# Patient Record
Sex: Female | Born: 1965 | Race: Black or African American | Hispanic: No | Marital: Married | Smoking: Never smoker
Health system: Southern US, Community
[De-identification: ages and names within clinical notes are randomized; demographics above are authoritative.]

## PROBLEM LIST (undated history)

## (undated) HISTORY — PX: TUBAL LIGATION: SHX77

---

## 2002-11-01 ENCOUNTER — Emergency Department (HOSPITAL_COMMUNITY): Admission: EM | Admit: 2002-11-01 | Discharge: 2002-11-01 | Payer: Self-pay | Admitting: Emergency Medicine

## 2016-11-06 LAB — BASIC METABOLIC PANEL: Glucose: 107 mg/dL

## 2019-07-11 ENCOUNTER — Other Ambulatory Visit: Payer: Self-pay

## 2019-07-11 DIAGNOSIS — Z20822 Contact with and (suspected) exposure to covid-19: Secondary | ICD-10-CM

## 2019-07-15 LAB — NOVEL CORONAVIRUS, NAA: SARS-CoV-2, NAA: NOT DETECTED

## 2019-07-20 ENCOUNTER — Telehealth: Payer: Self-pay | Admitting: *Deleted

## 2019-07-20 NOTE — Telephone Encounter (Signed)
Patient notified of negative COVID results- Patient is not having symptoms. Advised to follow up with PCP for new symptoms and retest.

## 2020-03-02 ENCOUNTER — Ambulatory Visit: Payer: Self-pay | Attending: Internal Medicine

## 2020-03-02 DIAGNOSIS — Z23 Encounter for immunization: Secondary | ICD-10-CM | POA: Insufficient documentation

## 2020-03-02 NOTE — Progress Notes (Signed)
   Covid-19 Vaccination Clinic  Name:  Lashe Oliveira    MRN: [id number] DOB: August 28, 1966  03/02/2020  Ms. Flamm was observed post Covid-19 immunization for 15 minutes without incident. She was provided with Vaccine Information Sheet and instruction to access the V-Safe system.   Ms. Lightle was instructed to call 911 with any severe reactions post vaccine: Marland Kitchen Difficulty breathing  . Swelling of face and throat  . A fast heartbeat  . A bad rash all over body  . Dizziness and weakness   Immunizations Administered    Name Date Dose VIS Date Route   Pfizer COVID-19 Vaccine 03/02/2020  8:38 AM 0.3 mL 12/07/2019 Intramuscular   Manufacturer: [employer], [employer]   Lot: [id number]   NDC: [address]-7

## 2020-03-21 ENCOUNTER — Emergency Department (HOSPITAL_COMMUNITY): Payer: No Typology Code available for payment source

## 2020-03-21 ENCOUNTER — Encounter (HOSPITAL_COMMUNITY): Payer: Self-pay

## 2020-03-21 ENCOUNTER — Other Ambulatory Visit: Payer: Self-pay

## 2020-03-21 ENCOUNTER — Emergency Department (HOSPITAL_COMMUNITY)
Admission: EM | Admit: 2020-03-21 | Discharge: 2020-03-21 | Disposition: A | Discharge status: Home / Self Care | Payer: No Typology Code available for payment source | Attending: Emergency Medicine | Admitting: Emergency Medicine

## 2020-03-21 DIAGNOSIS — S92351A Displaced fracture of fifth metatarsal bone, right foot, initial encounter for closed fracture: Secondary | ICD-10-CM | POA: Diagnosis not present

## 2020-03-21 DIAGNOSIS — Y92219 Unspecified school as the place of occurrence of the external cause: Secondary | ICD-10-CM | POA: Insufficient documentation

## 2020-03-21 DIAGNOSIS — Y939 Activity, unspecified: Secondary | ICD-10-CM | POA: Diagnosis not present

## 2020-03-21 DIAGNOSIS — Y990 Civilian activity done for income or pay: Secondary | ICD-10-CM | POA: Insufficient documentation

## 2020-03-21 DIAGNOSIS — S99921A Unspecified injury of right foot, initial encounter: Secondary | ICD-10-CM | POA: Diagnosis present

## 2020-03-21 DIAGNOSIS — W010XXA Fall on same level from slipping, tripping and stumbling without subsequent striking against object, initial encounter: Secondary | ICD-10-CM | POA: Diagnosis not present

## 2020-03-21 MED ORDER — HYDROCODONE-ACETAMINOPHEN 5-325 MG PO TABS: 1.0000 | ORAL_TABLET | Freq: Four times a day (QID) | ORAL | 0 refills | Status: AC | PRN

## 2020-03-21 MED ORDER — ONDANSETRON 4 MG PO TBDP
4.0000 mg | ORAL_TABLET | Freq: Once | ORAL | Status: AC
  Administered 2020-03-21: 4 mg via ORAL
  Filled 2020-03-21: qty 1

## 2020-03-21 MED ORDER — HYDROCODONE-ACETAMINOPHEN 5-325 MG PO TABS
1.0000 | ORAL_TABLET | Freq: Once | ORAL | Status: AC
  Administered 2020-03-21: 1 via ORAL
  Filled 2020-03-21: qty 1

## 2020-03-21 NOTE — ED Provider Notes (Signed)
[facility] EMERGENCY DEPARTMENT Provider Note   CSN: [id number] Arrival date & time: 03/21/20  1902     History Chief Complaint  Patient presents with  . Fall    right foot- dorsal lateral    Helen Berg is a 54 y.o. female who presents for evaluation of right foot pain after mechanical fall that occurred approxi-2 PM this afternoon.  Patient she was walking at her workplace and states she thinks she slipped on new shoes that she was wearing, causing her foot to bend.  She states that initially, she tried to ambulate and bear weight on it and was able to finish out the day but as the day continue, the foot became more painful, swollen.  She has not taken anything for pain.  She denies any numbness/weakness.  The history is provided by the patient.       History reviewed. No pertinent past medical history.  There are no problems to display for this patient.   Past Surgical History:  Procedure Laterality Date  . TUBAL LIGATION       OB History   No obstetric history on file.     History reviewed. No pertinent family history.  Social History   Tobacco Use  . Smoking status: Never Smoker  . Smokeless tobacco: Never Used  Substance Use Topics  . Alcohol use: Never  . Drug use: Never    Home Medications Prior to Admission medications   Medication Sig Start Date End Date Taking? Authorizing Provider  HYDROcodone-acetaminophen (NORCO/VICODIN) 5-325 MG tablet Take 1-2 tablets by mouth every 6 (six) hours as needed. 03/21/20   Volanda Napoleon, PA-C    Allergies    Patient has no known allergies.  Review of Systems   Review of Systems  Musculoskeletal:       Right foot pain  Neurological: Negative for weakness and numbness.  All other systems reviewed and are negative.   Physical Exam Updated Vital Signs BP (!) 160/88 (BP Location: Right Arm)   Pulse 90   Temp 98.4 F (36.9 C) (Oral)   Resp 16   Ht 5\' 2"  (1.575 m)   Wt 92.5 kg   SpO2 100%   BMI  37.31 kg/m   Physical Exam Vitals and nursing note reviewed.  Constitutional:      Appearance: She is well-developed.  HENT:     Head: Normocephalic and atraumatic.  Eyes:     General: No scleral icterus.       Right eye: No discharge.        Left eye: No discharge.     Conjunctiva/sclera: Conjunctivae normal.  Cardiovascular:     Pulses:          Dorsalis pedis pulses are 2+ on the right side and 2+ on the left side.  Pulmonary:     Effort: Pulmonary effort is normal.  Musculoskeletal:     Comments: Tenderness palpation noted to the dorsal aspect and lateral aspect of the right foot, vertically over the fourth, fifth metatarsals.  There is overlying soft tissue swelling.  No deformity or crepitus noted.  She can wiggle all 5 digits of her right foot without any difficulty.  No tenderness palpation in bilateral malleolus, proximal tib-fib, distal tib-fib.  No tenderness palpation of the left lower extremity.  Skin:    General: Skin is warm and dry.     Capillary Refill: Capillary refill takes less than 2 seconds.     Comments: Good distal cap refill.  RLE is not dusky in appearance or cool to touch.  Neurological:     Mental Status: She is alert.     Comments: Sensation intact along major nerve distributions of BLE  Psychiatric:        Speech: Speech normal.        Behavior: Behavior normal.     ED Results / Procedures / Treatments   Labs (all labs ordered are listed, but only abnormal results are displayed) Labs Reviewed - No data to display  EKG None  Radiology DG Foot Complete Right  Result Date: 03/21/2020 CLINICAL DATA:  Right foot pain EXAM: RIGHT FOOT COMPLETE - 3+ VIEW COMPARISON:  None. FINDINGS: There is a comminuted intra-articular obliquely oriented fracture seen at the base of the fifth metatarsal. Overlying soft tissue swelling is seen. No other fractures are seen. IMPRESSION: Comminuted obliquely oriented fifth metatarsal base fracture. Electronically Signed    By: Jonna Clark M.D.   On: 03/21/2020 20:06    Procedures Procedures (including critical care time)  Medications Ordered in ED Medications  HYDROcodone-acetaminophen (NORCO/VICODIN) 5-325 MG per tablet 1 tablet (1 tablet Oral Given 03/21/20 2027)  ondansetron (ZOFRAN-ODT) disintegrating tablet 4 mg (4 mg Oral Given 03/21/20 2028)    ED Course  I have reviewed the triage vital signs and the nursing notes.  Pertinent labs & imaging results that were available during my care of the patient were reviewed by me and considered in my medical decision making (see chart for details).    MDM Rules/Calculators/A&P                      54 year old female who presents for evaluation of right foot pain after a fall at 2 PM this afternoon.  Was initially able to bear some weight but reported continued pain and swelling, prompting ED visit.  On initial ED arrival, she is afebrile, nontoxic-appearing.  She is neurovascularly intact.  Eitel signs are stable.  Tenderness palpation on dorsal and lateral aspect of the right foot.  Concern for fracture versus dislocation.   X-rays ordered at triage.  XR shows a comminuted obliquely oriented fifth metatarsal base fracture.   I discussed results with patient.  We will make her nonweightbearing and put her in a cam walker boot.  Patient states she already has crutches at home.  We will give her outpatient orthopedic follow-up. Instructed patient to follow up with ortho. Will give short course of pain medication for acute pain. At this time, patient exhibits no emergent life-threatening condition that require further evaluation in ED or admission. Patient had ample opportunity for questions and discussion. All patient's questions were answered with full understanding. Strict return precautions discussed. Patient expresses understanding and agreement to plan.   Portions of this note were generated with Scientist, clinical (histocompatibility and immunogenetics). Dictation errors may occur despite  best attempts at proofreading.  Final Clinical Impression(s) / ED Diagnoses Final diagnoses:  Closed fracture of base of fifth metatarsal bone of right foot, initial encounter    Rx / DC Orders ED Discharge Orders         Ordered    HYDROcodone-acetaminophen (NORCO/VICODIN) 5-325 MG tablet  Every 6 hours PRN     03/21/20 2043           Rosana Hoes 03/21/20 2108    Donnetta Hutching, MD 03/21/20 2228

## 2020-03-21 NOTE — ED Triage Notes (Signed)
Pt reports falling today while at school, pt wearing new sandals, slipped and fall injuring right foot, dorsal lateral aspect. No deformity noted. Pulses intact. Mild edema.

## 2020-03-21 NOTE — ED Notes (Signed)
Slipped in new shoes today while at school  R foot pain laterally  Ambulates heel to toe slowly without change of gait

## 2020-03-21 NOTE — Discharge Instructions (Signed)
You can take Tylenol or Ibuprofen as directed for pain. You can alternate Tylenol and Ibuprofen every 4 hours. If you take Tylenol at 1pm, then you can take Ibuprofen at 5pm. Then you can take Tylenol again at 9pm.   Take pain medications as directed for break through pain. Do not drive or operate machinery while taking this medication.   Use cam walker boot and crutches.  You should not bear any weight until seen by orthopedic.  As we discussed, I have provided you our on-call orthopedic Dr. Linna Caprice.  Call his office arrange for an appointment.    Follow the RICE (Rest, Ice, Compression, Elevation) protocol as directed.   Return to the emergency department for any worsening pain, numbness/weakness, discoloration, worsening swelling or any other worsening concerning symptoms.

## 2020-03-23 ENCOUNTER — Ambulatory Visit: Payer: Self-pay | Attending: Internal Medicine

## 2020-03-23 DIAGNOSIS — Z23 Encounter for immunization: Secondary | ICD-10-CM

## 2020-03-23 NOTE — Progress Notes (Signed)
   Covid-19 Vaccination Clinic  Name:  Helen Berg    MRN: [id number] DOB: [birth year]  03/23/2020  Helen Berg was observed post Covid-19 immunization for 15 minutes without incident. She was provided with Vaccine Information Sheet and instruction to access the V-Safe system.   Helen Berg was instructed to call 911 with any severe reactions post vaccine: Marland Kitchen Difficulty breathing  . Swelling of face and throat  . A fast heartbeat  . A bad rash all over body  . Dizziness and weakness   Immunizations Administered    Name Date Dose VIS Date Route   Pfizer COVID-19 Vaccine 03/23/2020  8:54 AM 0.3 mL 12/07/2019 Intramuscular   Manufacturer: [employer], [employer]   Lot: [id number]   NDC: [address]-2

## 2020-10-30 ENCOUNTER — Ambulatory Visit: Payer: POS | Attending: Internal Medicine

## 2020-10-30 DIAGNOSIS — Z23 Encounter for immunization: Secondary | ICD-10-CM

## 2020-10-30 NOTE — Progress Notes (Signed)
   Covid-19 Vaccination Clinic  Name:  ORIEL OJO    MRN: [id number] DOB: 1966-06-01  10/30/2020  Ms. Lenoir was observed post Covid-19 immunization for 15 minutes without incident. She was provided with Vaccine Information Sheet and instruction to access the V-Safe system.   Ms. Slinker was instructed to call 911 with any severe reactions post vaccine: Marland Kitchen Difficulty breathing  . Swelling of face and throat  . A fast heartbeat  . A bad rash all over body  . Dizziness and weakness

## 2021-10-27 IMAGING — DX DG FOOT COMPLETE 3+V*R*
3 series · 3 of 3 positions shown · non-contrast
Comparison: None.

CLINICAL DATA: Right foot pain

EXAM:
RIGHT FOOT COMPLETE - 3+ VIEW

[foot ap]
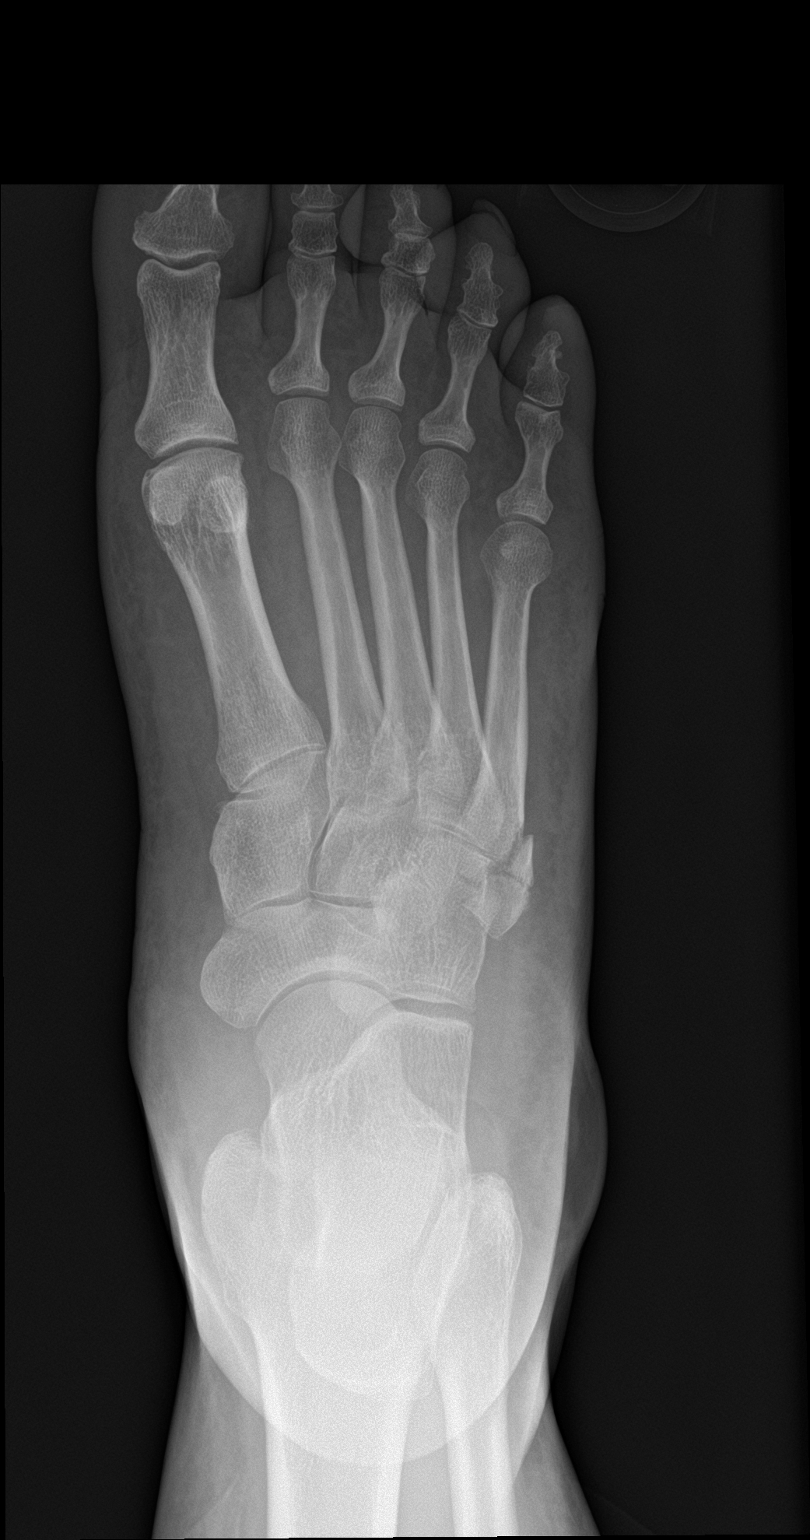

[foot obl]
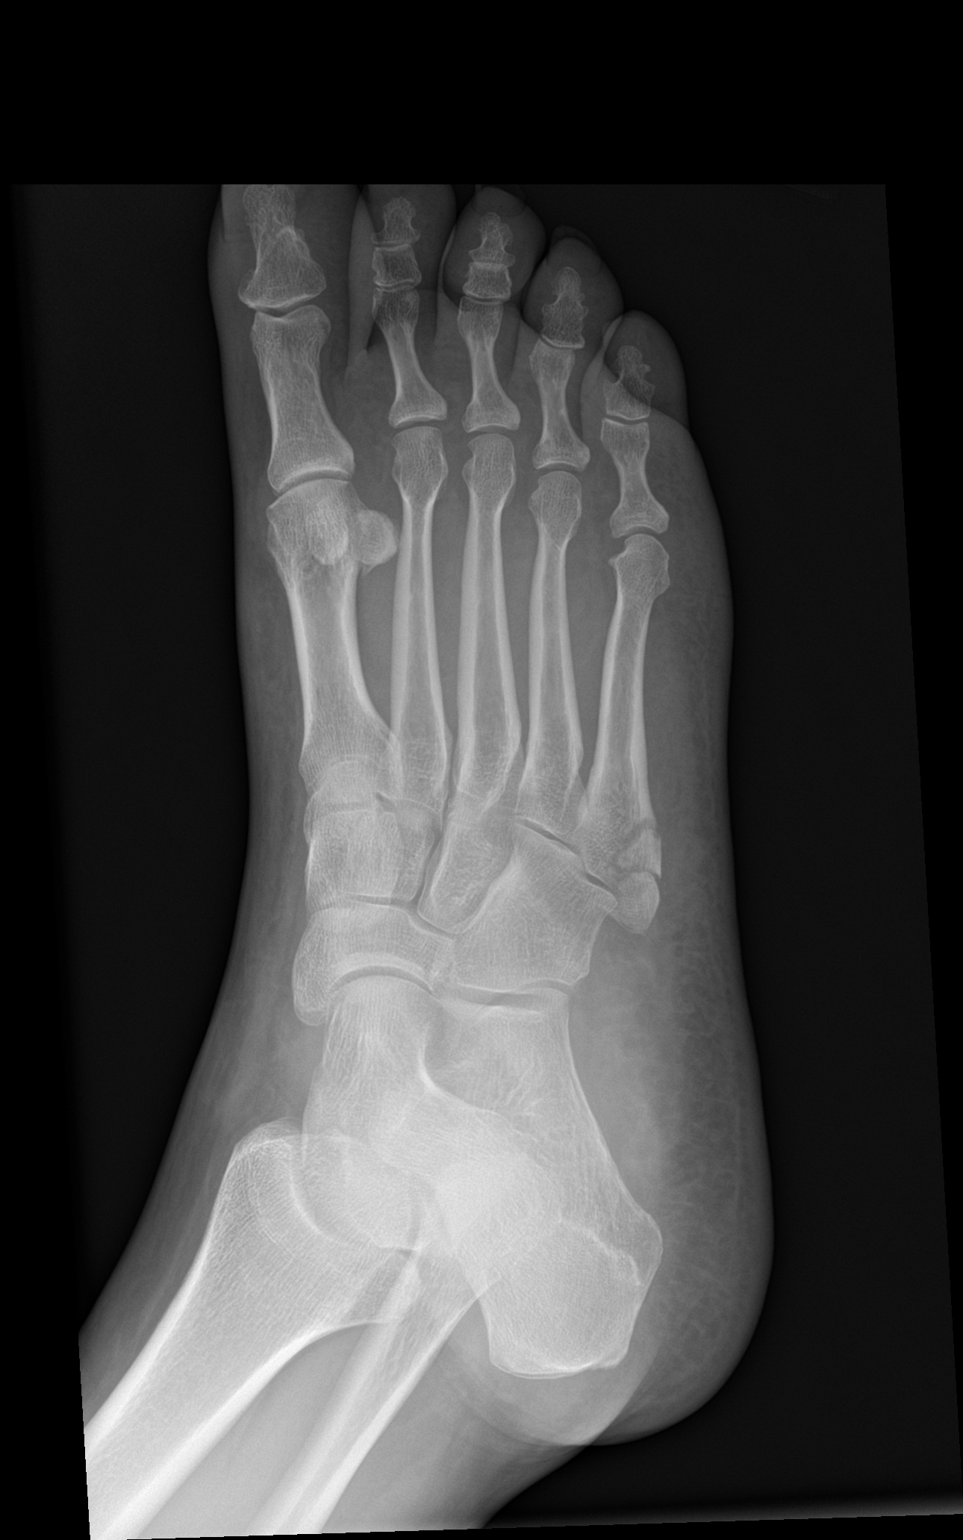

[foot lat]
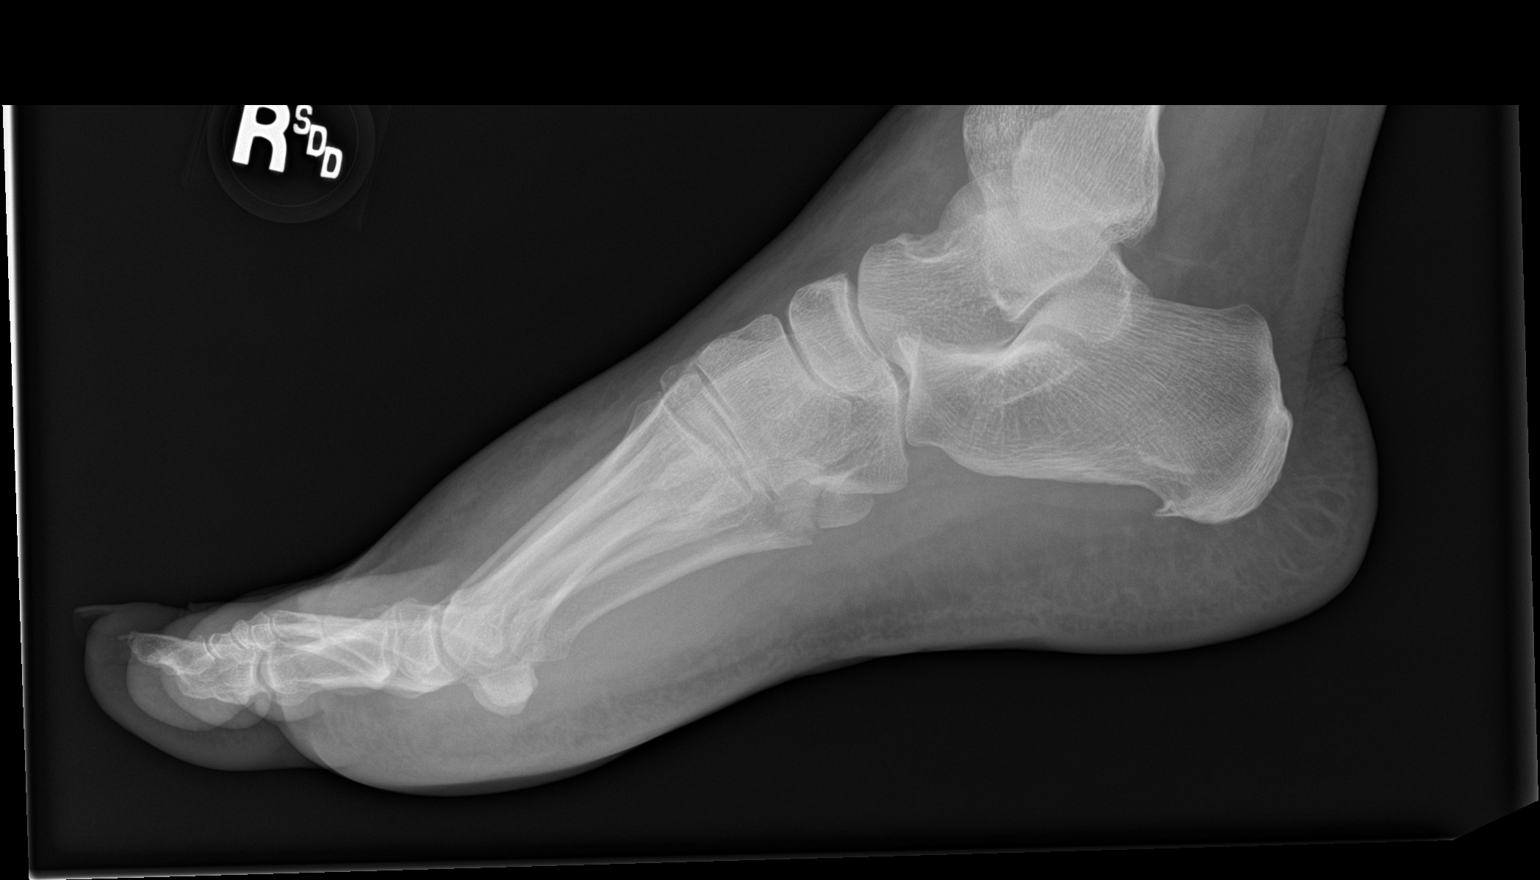

[3 of 3 positions shown; findings below may reference images not displayed]

FINDINGS: There is a comminuted intra-articular obliquely oriented fracture
seen at the base of the fifth metatarsal. Overlying soft tissue
swelling is seen. No other fractures are seen.
IMPRESSION: Comminuted obliquely oriented fifth metatarsal base fracture.

## 2024-01-27 ENCOUNTER — Emergency Department (HOSPITAL_COMMUNITY)
Admission: EM | Admit: 2024-01-27 | Discharge: 2024-01-27 | Disposition: A | Discharge status: Home / Self Care | Payer: 59 | Attending: Emergency Medicine | Admitting: Emergency Medicine

## 2024-01-27 ENCOUNTER — Other Ambulatory Visit: Payer: Self-pay

## 2024-01-27 ENCOUNTER — Encounter (HOSPITAL_COMMUNITY): Payer: Self-pay | Admitting: Emergency Medicine

## 2024-01-27 DIAGNOSIS — M79604 Pain in right leg: Secondary | ICD-10-CM | POA: Diagnosis present

## 2024-01-27 LAB — CBC WITH DIFFERENTIAL/PLATELET
Abs Immature Granulocytes: 0.01 10*3/uL (ref 0.00–0.07)
Basophils Absolute: 0 10*3/uL (ref 0.0–0.1)
Basophils Relative: 1 %
Eosinophils Absolute: 0.1 10*3/uL (ref 0.0–0.5)
Eosinophils Relative: 2 %
HCT: 40.4 % (ref 36.0–46.0)
Hemoglobin: 12.6 g/dL (ref 12.0–15.0)
Immature Granulocytes: 0 %
Lymphocytes Relative: 37 %
Lymphs Abs: 2.1 10*3/uL (ref 0.7–4.0)
MCH: 24.4 pg — ABNORMAL LOW (ref 26.0–34.0)
MCHC: 31.2 g/dL (ref 30.0–36.0)
MCV: 78.3 fL — ABNORMAL LOW (ref 80.0–100.0)
Monocytes Absolute: 0.5 10*3/uL (ref 0.1–1.0)
Monocytes Relative: 10 %
Neutro Abs: 2.9 10*3/uL (ref 1.7–7.7)
Neutrophils Relative %: 50 %
Platelets: 247 10*3/uL (ref 150–400)
RBC: 5.16 MIL/uL — ABNORMAL HIGH (ref 3.87–5.11)
RDW: 13.5 % (ref 11.5–15.5)
WBC: 5.6 10*3/uL (ref 4.0–10.5)
nRBC: 0 % (ref 0.0–0.2)

## 2024-01-27 LAB — BASIC METABOLIC PANEL
Anion gap: 9 (ref 5–15)
BUN: 11 mg/dL (ref 6–20)
CO2: 25 mmol/L (ref 22–32)
Calcium: 9.3 mg/dL (ref 8.9–10.3)
Chloride: 105 mmol/L (ref 98–111)
Creatinine, Ser: 0.96 mg/dL (ref 0.44–1.00)
GFR, Estimated: >60 mL/min (ref >60)
Glucose, Bld: 123 mg/dL — ABNORMAL HIGH (ref 70–99)
Potassium: 3.5 mmol/L (ref 3.5–5.1)
Sodium: 139 mmol/L (ref 135–145)

## 2024-01-27 MED ORDER — RIVAROXABAN 15 MG PO TABS
15.0000 mg | ORAL_TABLET | Freq: Once | ORAL | Status: AC
  Administered 2024-01-27: 15 mg via ORAL
  Filled 2024-01-27: qty 1

## 2024-01-27 NOTE — ED Provider Notes (Signed)
[place] EMERGENCY DEPARTMENT AT [facility] Provider Note   CSN: [id number] Arrival date & time: 01/27/24  1850     History {Add pertinent medical, surgical, social history, OB history to HPI:1} Chief Complaint  Patient presents with   Leg Pain    Helen Berg is a 58 y.o. female.  For complaint of "weird sensation" on right outer thigh for the past 3 days that is intermittent.  No calf pain or swelling, no fever or chills, patient states she looked up online and saw that it could be a blood clot became very worried, no chest pain or shortness of breath.  She notes she did have a recent long car ride when on vacation and was not moving around a lot.  She denies fevers, denies chills, denies redness or warmth of her extremity, denies injury or trauma, denies burning or tingling to her leg, no back pain.  No abdominal pain, no other complaints.   Leg Pain      Home Medications Prior to Admission medications   Medication Sig Start Date End Date Taking? Authorizing Provider  HYDROcodone-acetaminophen (NORCO/VICODIN) 5-325 MG tablet Take 1-2 tablets by mouth every 6 (six) hours as needed. 03/21/20   Maxwell Caul, PA-C      Allergies    Patient has no known allergies.    Review of Systems   Review of Systems  Physical Exam Updated Vital Signs BP 136/80 (BP Location: Right Arm)   Pulse 84   Temp 98.8 F (37.1 C) (Oral)   Resp 16   Ht 5\' 2"  (1.575 m)   Wt 90.7 kg   SpO2 98%   BMI 36.58 kg/m  Physical Exam Vitals and nursing note reviewed.  Constitutional:      General: She is not in acute distress.    Appearance: She is well-developed.  HENT:     Head: Normocephalic and atraumatic.  Eyes:     Conjunctiva/sclera: Conjunctivae normal.  Cardiovascular:     Rate and Rhythm: Normal rate and regular rhythm.     Heart sounds: No murmur heard. Pulmonary:     Effort: Pulmonary effort is normal. No respiratory distress.     Breath sounds: Normal breath  sounds.  Abdominal:     Palpations: Abdomen is soft.     Tenderness: There is no abdominal tenderness.  Musculoskeletal:        General: No swelling.     Cervical back: Neck supple.     Right lower leg: No edema.     Left lower leg: No edema.  Skin:    General: Skin is warm and dry.     Capillary Refill: Capillary refill takes less than 2 seconds.  Neurological:     Mental Status: She is alert.  Psychiatric:        Mood and Affect: Mood normal.     ED Results / Procedures / Treatments   Labs (all labs ordered are listed, but only abnormal results are displayed) Labs Reviewed  BASIC METABOLIC PANEL - Abnormal; Notable for the following components:      Result Value   Glucose, Bld 123 (*)    All other components within normal limits  CBC WITH DIFFERENTIAL/PLATELET - Abnormal; Notable for the following components:   RBC 5.16 (*)    MCV 78.3 (*)    MCH 24.4 (*)    All other components within normal limits    EKG None  Radiology No results found.  Procedures Procedures  {  Document cardiac monitor, telemetry assessment procedure when appropriate:1}  Medications Ordered in ED Medications  Rivaroxaban (XARELTO) tablet 15 mg (15 mg Oral Given 01/27/24 2312)    ED Course/ Medical Decision Making/ A&P   {   Click here for ABCD2, HEART and other calculatorsREFRESH Note before signing :1}                              Medical Decision Making Amount and/or Complexity of Data Reviewed Labs: ordered.  Risk Prescription drug management.   ***  {Document critical care time when appropriate:1} {Document review of labs and clinical decision tools ie heart score, Chads2Vasc2 etc:1}  {Document your independent review of radiology images, and any outside records:1} {Document your discussion with family members, caretakers, and with consultants:1} {Document social determinants of health affecting pt's care:1} {Document your decision making why or why not admission,  treatments were needed:1} Final Clinical Impression(s) / ED Diagnoses Final diagnoses:  Right leg pain    Rx / DC Orders ED Discharge Orders          Ordered    Lower Ext Right Venous US       Comments: IMPORTANT PATIENT INSTRUCTIONS:  Your ED provider has recommended an Outpatient Ultrasound.  Please call [phone] to schedule an appointment.  If your appointment is scheduled for a Saturday, Sunday or holiday, please go to the [facility] Emergency Department Registration Desk at least 15 minutes prior to your appointment time and tell them you are there for an ultrasound.    If your appointment is scheduled for a weekday (Monday-Friday), please go directly to the [facility] Radiology Department at least 15 minutes prior to your appointment time and tell them you are there for an ultrasound.  Please call [phone] with questions.   01/27/24 2242

## 2024-01-27 NOTE — ED Triage Notes (Signed)
Pt via POV c/o right lateral thigh pain, intermittently since 2 days ago. Pain rated 2/10 and pt is concerned that she may have a blood clot. No prior hx DVT, no thinners. No swelling distal to affected area.

## 2024-01-27 NOTE — Discharge Instructions (Signed)
You are seen in the ER for right thigh pain.  Your blood work was reassuring, we ordered ultrasound to evaluate for possible DVT, please follow the instructions and return to the ER.  As discussed you are given a dose of blood thinner tonight.  You are overall low risk for severe or life-threatening bleeding but if you experience bleeding or if you would hit your head in the next 12 hours you need to come back to the ER.

## 2024-01-28 ENCOUNTER — Ambulatory Visit (HOSPITAL_COMMUNITY)
Admission: RE | Admit: 2024-01-28 | Discharge: 2024-01-28 | Disposition: A | Discharge status: Home / Self Care | Payer: 59 | Source: Ambulatory Visit | Attending: Physician Assistant | Admitting: Physician Assistant

## 2024-01-28 ENCOUNTER — Other Ambulatory Visit (HOSPITAL_COMMUNITY): Payer: Self-pay | Admitting: Physician Assistant

## 2024-01-28 DIAGNOSIS — M79604 Pain in right leg: Secondary | ICD-10-CM | POA: Diagnosis present

## 2024-01-28 NOTE — ED Provider Notes (Signed)
Received preliminary report of venous Doppler of right leg with no evidence of acute abnormality. Patient informed of results and advised to follow-up with primary care doctor.   Margarita Grizzle, MD 01/28/24 [phone]
